# Patient Record
Sex: Male | Born: 1970
Health system: Southern US, Community
[De-identification: ages and names within clinical notes are randomized; demographics above are authoritative.]

---

## 2016-06-09 ENCOUNTER — Emergency Department (HOSPITAL_BASED_OUTPATIENT_CLINIC_OR_DEPARTMENT_OTHER)
Admission: EM | Admit: 2016-06-09 | Discharge: 2016-06-09 | Disposition: A | Discharge status: Home / Self Care | Payer: No Typology Code available for payment source | Attending: Emergency Medicine | Admitting: Emergency Medicine

## 2016-06-09 ENCOUNTER — Emergency Department (HOSPITAL_BASED_OUTPATIENT_CLINIC_OR_DEPARTMENT_OTHER): Payer: No Typology Code available for payment source

## 2016-06-09 ENCOUNTER — Encounter (HOSPITAL_BASED_OUTPATIENT_CLINIC_OR_DEPARTMENT_OTHER): Payer: Self-pay | Admitting: Emergency Medicine

## 2016-06-09 DIAGNOSIS — M542 Cervicalgia: Secondary | ICD-10-CM | POA: Insufficient documentation

## 2016-06-09 DIAGNOSIS — Y999 Unspecified external cause status: Secondary | ICD-10-CM | POA: Diagnosis not present

## 2016-06-09 DIAGNOSIS — Y9389 Activity, other specified: Secondary | ICD-10-CM | POA: Insufficient documentation

## 2016-06-09 DIAGNOSIS — F17200 Nicotine dependence, unspecified, uncomplicated: Secondary | ICD-10-CM | POA: Insufficient documentation

## 2016-06-09 DIAGNOSIS — Y92410 Unspecified street and highway as the place of occurrence of the external cause: Secondary | ICD-10-CM | POA: Insufficient documentation

## 2016-06-09 MED ORDER — HYDROCODONE-ACETAMINOPHEN 5-325 MG PO TABS: 2.0000 | ORAL_TABLET | ORAL | 0 refills | Status: DC | PRN

## 2016-06-09 MED ORDER — CYCLOBENZAPRINE HCL 10 MG PO TABS: 10.0000 mg | ORAL_TABLET | Freq: Two times a day (BID) | ORAL | 0 refills | Status: DC | PRN

## 2016-06-09 MED FILL — CYCLOBENZAPRINE 10 MG TAB: 10 | 10 days supply | Qty: 20 | Fill #0

## 2016-06-09 MED FILL — HYDROCODON-APAP 5-325: 5-325 | 1 days supply | Qty: 10 | Fill #0

## 2016-06-09 NOTE — ED Notes (Signed)
Patient back from the x-ray
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
## 2020-06-20 ENCOUNTER — Other Ambulatory Visit: Payer: Self-pay

## 2020-06-20 ENCOUNTER — Emergency Department (HOSPITAL_BASED_OUTPATIENT_CLINIC_OR_DEPARTMENT_OTHER)
Admission: EM | Admit: 2020-06-20 | Discharge: 2020-06-20 | Disposition: A | Discharge status: Home / Self Care | Payer: No Typology Code available for payment source | Attending: Emergency Medicine | Admitting: Emergency Medicine

## 2020-06-20 ENCOUNTER — Encounter (HOSPITAL_BASED_OUTPATIENT_CLINIC_OR_DEPARTMENT_OTHER): Payer: Self-pay | Admitting: *Deleted

## 2020-06-20 ENCOUNTER — Emergency Department (HOSPITAL_BASED_OUTPATIENT_CLINIC_OR_DEPARTMENT_OTHER): Payer: No Typology Code available for payment source

## 2020-06-20 DIAGNOSIS — F17200 Nicotine dependence, unspecified, uncomplicated: Secondary | ICD-10-CM | POA: Diagnosis not present

## 2020-06-20 DIAGNOSIS — Y92410 Unspecified street and highway as the place of occurrence of the external cause: Secondary | ICD-10-CM | POA: Insufficient documentation

## 2020-06-20 DIAGNOSIS — S39012A Strain of muscle, fascia and tendon of lower back, initial encounter: Secondary | ICD-10-CM | POA: Insufficient documentation

## 2020-06-20 DIAGNOSIS — S161XXA Strain of muscle, fascia and tendon at neck level, initial encounter: Secondary | ICD-10-CM | POA: Insufficient documentation

## 2020-06-20 DIAGNOSIS — S199XXA Unspecified injury of neck, initial encounter: Secondary | ICD-10-CM | POA: Diagnosis present

## 2020-06-20 DIAGNOSIS — T07XXXA Unspecified multiple injuries, initial encounter: Secondary | ICD-10-CM

## 2020-06-20 MED ORDER — KETOROLAC TROMETHAMINE 30 MG/ML IJ SOLN
30.0000 mg | Freq: Once | INTRAMUSCULAR | Status: AC
  Administered 2020-06-20: 30 mg via INTRAMUSCULAR
  Filled 2020-06-20: qty 1

## 2020-06-20 MED ORDER — IBUPROFEN 800 MG PO TABS
800.0000 mg | ORAL_TABLET | Freq: Three times a day (TID) | ORAL | 0 refills | Status: AC | PRN
Start: 2020-06-20 — End: ?

## 2020-06-20 MED ORDER — CYCLOBENZAPRINE HCL 10 MG PO TABS: 10.0000 mg | ORAL_TABLET | Freq: Two times a day (BID) | ORAL | 0 refills | Status: AC | PRN

## 2020-06-20 MED ORDER — ACETAMINOPHEN 325 MG PO TABS
650.0000 mg | ORAL_TABLET | Freq: Once | ORAL | Status: AC
  Administered 2020-06-20: 650 mg via ORAL
  Filled 2020-06-20: qty 2
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
## 2020-09-02 ENCOUNTER — Other Ambulatory Visit: Payer: Self-pay

## 2020-09-02 ENCOUNTER — Encounter (HOSPITAL_BASED_OUTPATIENT_CLINIC_OR_DEPARTMENT_OTHER): Payer: Self-pay

## 2020-09-02 ENCOUNTER — Emergency Department (HOSPITAL_BASED_OUTPATIENT_CLINIC_OR_DEPARTMENT_OTHER): Payer: Self-pay

## 2020-09-02 ENCOUNTER — Emergency Department (HOSPITAL_BASED_OUTPATIENT_CLINIC_OR_DEPARTMENT_OTHER)
Admission: EM | Admit: 2020-09-02 | Discharge: 2020-09-02 | Disposition: A | Discharge status: Home / Self Care | Payer: Self-pay | Attending: Emergency Medicine | Admitting: Emergency Medicine

## 2020-09-02 DIAGNOSIS — F17200 Nicotine dependence, unspecified, uncomplicated: Secondary | ICD-10-CM | POA: Insufficient documentation

## 2020-09-02 DIAGNOSIS — W010XXA Fall on same level from slipping, tripping and stumbling without subsequent striking against object, initial encounter: Secondary | ICD-10-CM | POA: Insufficient documentation

## 2020-09-02 DIAGNOSIS — S5002XA Contusion of left elbow, initial encounter: Secondary | ICD-10-CM | POA: Insufficient documentation
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
## 2020-09-02 NOTE — ED Notes (Signed)
ED Provider at bedside. 
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
## 2021-06-11 IMAGING — DX DG ELBOW COMPLETE 3+V*L*
5 series · 5 of 5 positions shown · non-contrast
Comparison: 06/21/2019

CLINICAL DATA: Elbow pain and swelling.

EXAM:
LEFT ELBOW - COMPLETE 3+ VIEW

[elbow lat]
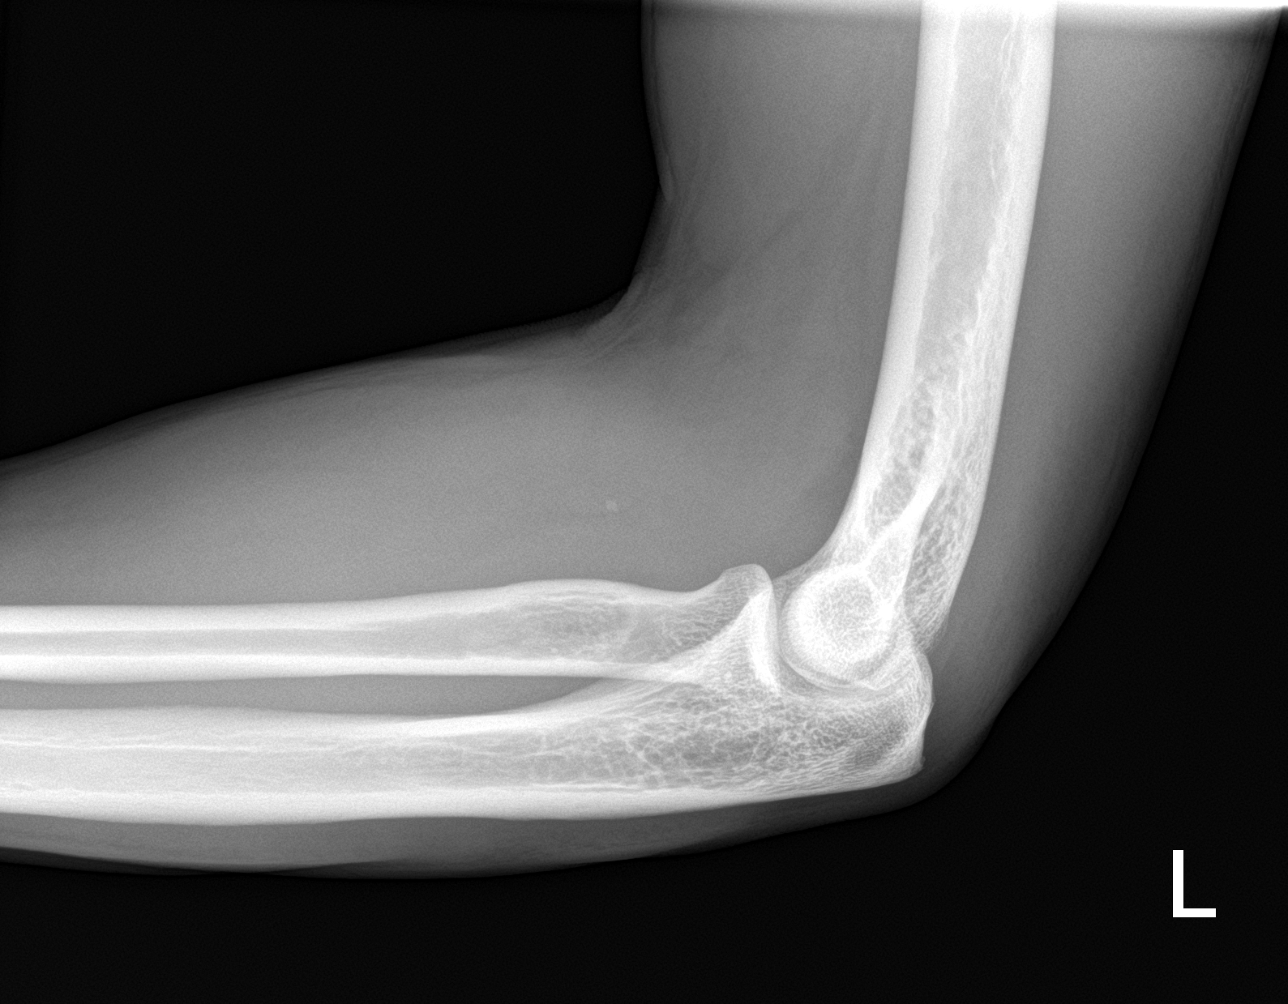

[elbow obl (1 of 2)]
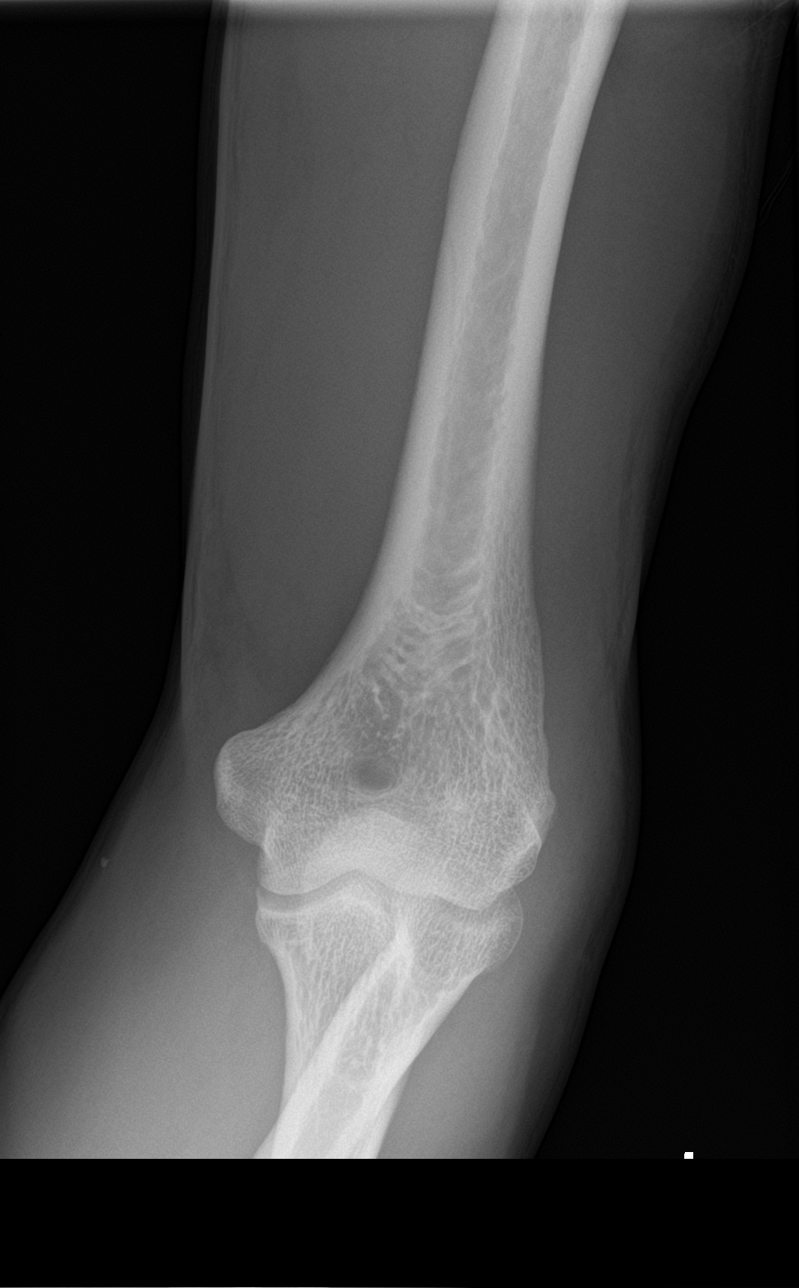

[elbow obl (2 of 2)]
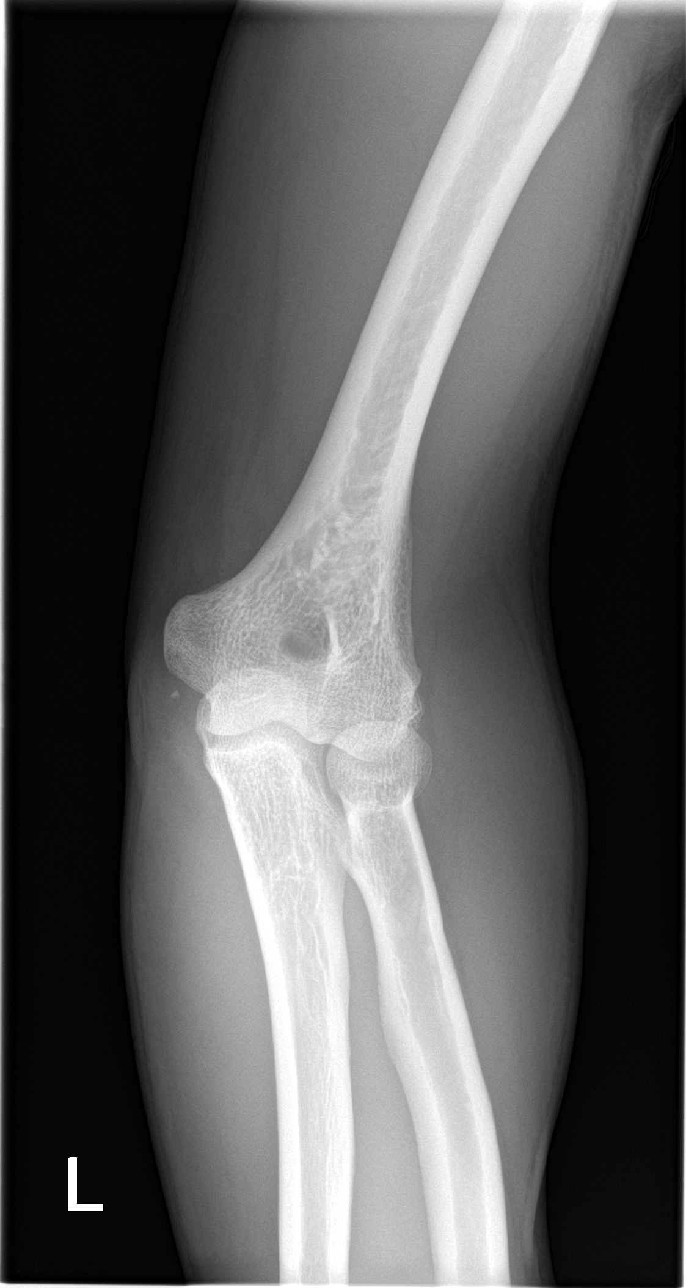

[elbow ap (1 of 2)]
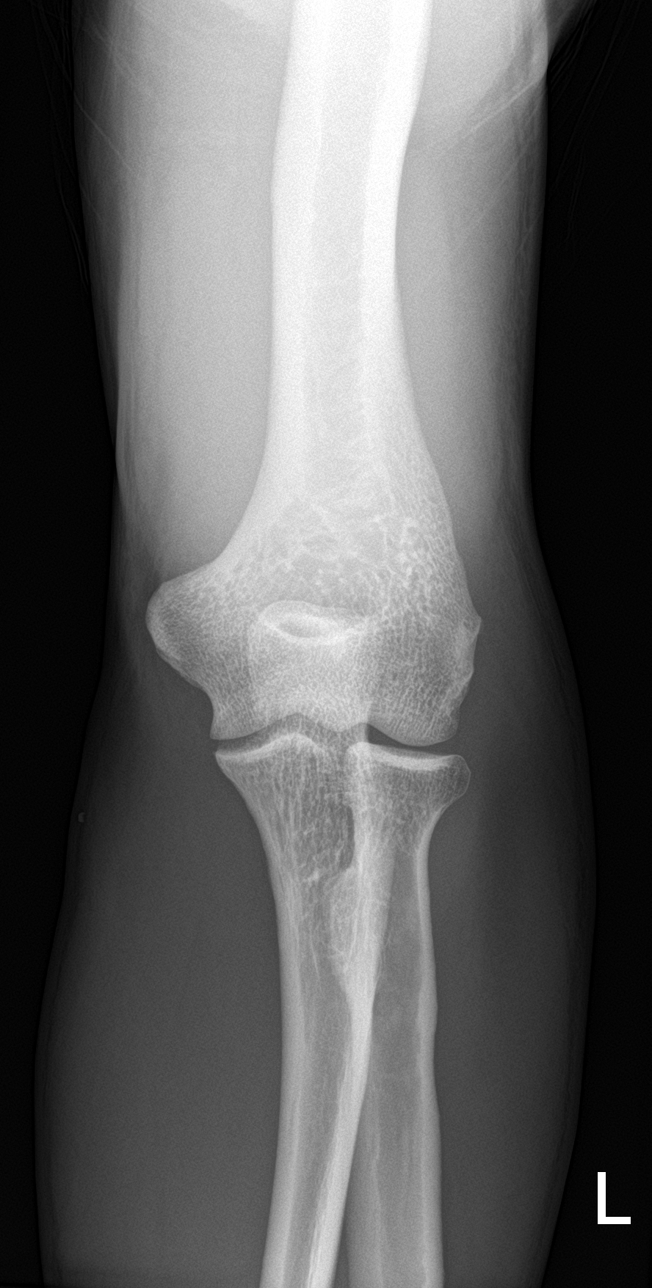

[elbow ap (2 of 2)]
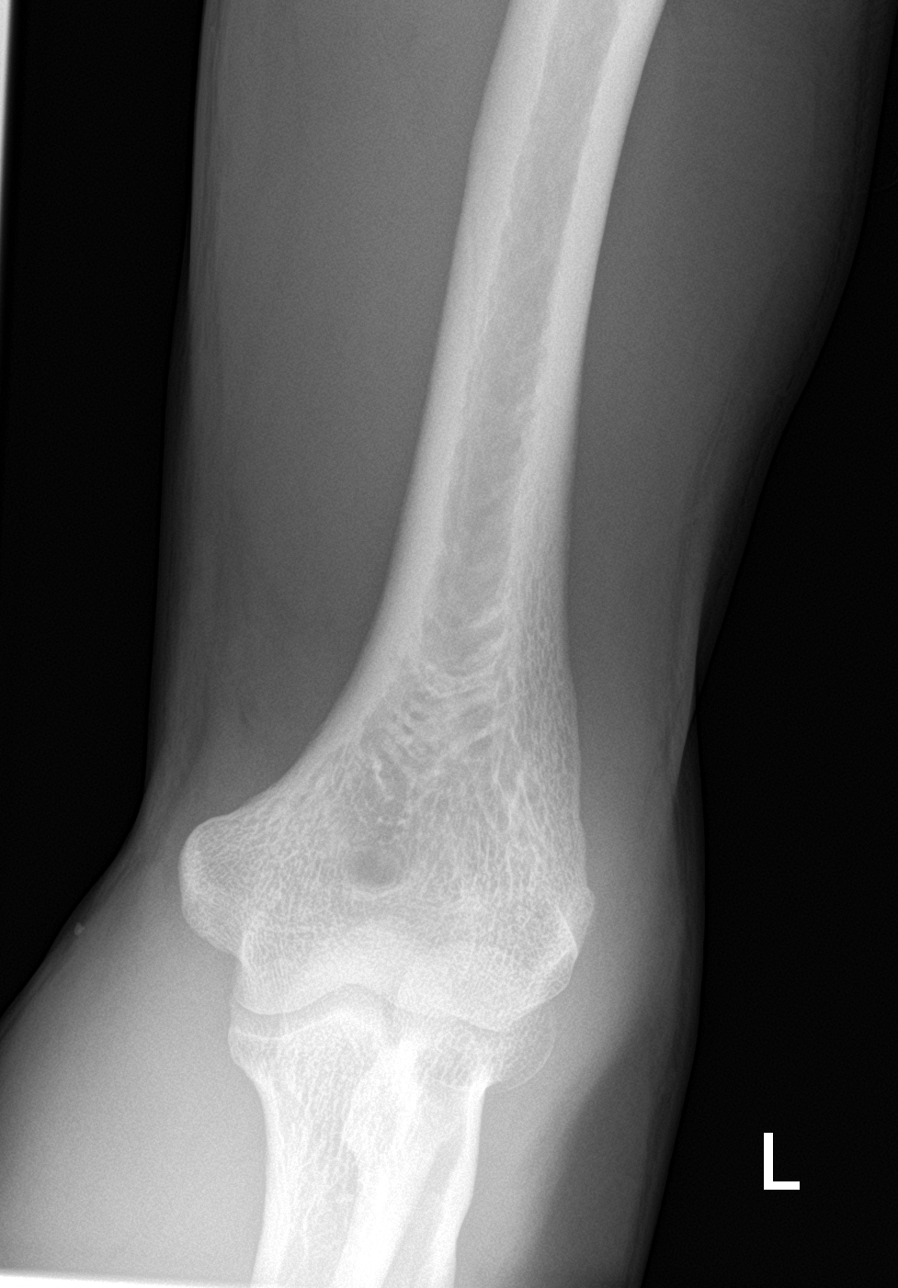

[5 of 5 positions shown; findings below may reference images not displayed]

FINDINGS: There is no evidence of fracture, dislocation, or joint effusion.
There is no evidence of arthropathy or other focal bone abnormality.
Unchanged appearance of small rounded subcutaneous calcific density
within the anterolateral soft tissues at the level of the
antecubital fossa.
IMPRESSION: 1. No acute fracture or dislocation.
